# Patient Record
Sex: Female | Born: 1965 | Race: Black or African American | Hispanic: No | Smoking: Never smoker
Health system: Southern US, Community
[De-identification: ages and names within clinical notes are randomized; demographics above are authoritative.]

## PROBLEM LIST (undated history)

## (undated) DIAGNOSIS — J45909 Unspecified asthma, uncomplicated: Secondary | ICD-10-CM

## (undated) HISTORY — DX: Unspecified asthma, uncomplicated: J45.909

---

## 2014-11-14 ENCOUNTER — Institutional Professional Consult (permissible substitution): Payer: Self-pay | Admitting: Pulmonary Disease

## 2014-11-30 ENCOUNTER — Encounter: Payer: Self-pay | Admitting: Pulmonary Disease

## 2014-11-30 ENCOUNTER — Ambulatory Visit (INDEPENDENT_AMBULATORY_CARE_PROVIDER_SITE_OTHER): Payer: BLUE CROSS/BLUE SHIELD | Admitting: Pulmonary Disease

## 2014-11-30 VITALS — BP 136/72 | HR 84 | Ht 60.0 in | Wt 140.2 lb

## 2014-11-30 DIAGNOSIS — R0600 Dyspnea, unspecified: Secondary | ICD-10-CM | POA: Diagnosis not present

## 2014-11-30 NOTE — Patient Instructions (Signed)
We will schedule you for a CT of the chest to further evaluate the apical scarring and pneumothorax.  Return to clinic in 1 month after the scan for further workup and evaluation.

## 2014-11-30 NOTE — Progress Notes (Signed)
Subjective:    Patient ID: Lori Romero, female    DOB: May 26, 1965, 49 y.o.   MRN: [id number]  HPI Consult for abnormal chest x-ray, positive QuantiFERON Gold.  Lori Romero is a 49 year old immigrant from [place]. She sent here for evaluation of abnormal chest x-ray and a positive QuantiFERON Gold test. He has history of severe scoliosis, and asthma. She developed asthma in 2008 after a brief visit to [place]. She was in and out of the hospital with a respiratory illness and was prescribed an inhaler. She took this for a few days and then stopped because it made her feel worse. She reports that she had multiple chest x-rays at that time and also one just before her return to [place] this year. She was not told that there were any abnormalities on her chest imaging.  She was evaluated at her new school with a QuantiFERON test which was positive in August 16. A subsequent chest x-ray showed right apical pleural scarring with a chronic pneumothorax. I do not have this image to review. She is sent here for further evaluation.   She was born and brought up in [place]. She had a brief visit the [place] in 2008. She moved back here today to go to college. She does not have any history of TB or known contact. She does not smoke or drink. She had been tested for HIV in the past and it was negative. She was monogamous with her husband but separated right now. She has never been homeless or incarcerated.  Today in the office she denies any cough, dyspnea, sputum production, fevers, chills, hemoptysis.  DATA: Chest x-ray [10/10/14] Very severe dextroscoliosis. Left lung clear Right apical pleural scarring Chronic pneumothorax right apex  QuantiFERON Gold [09/08/14] Positive  Past Medical History  Diagnosis Date  . Asthma    No current outpatient prescriptions on file.   Review of Systems Denies any cough, dyspnea, sputum production, fevers, chills, hemoptysis. Denies any malaise or fatigue, loss of  weight or appetite. Denies any nausea, vomiting, diarrhea, constipation. Denies any chest pain, palpitations. All other review of systems are negative.    Objective:   Physical Exam Blood pressure 136/72, pulse 84, height 5' (1.524 m), weight 140 lb 3.2 oz (63.594 kg), SpO2 97 %. Gen:No apparent distress Neuro: No gross focal deficits. Neck: No JVD, lymphadenopathy, thyromegaly. RS: Clear, no wheeze, crackles. Nonlabored breathing. CVS: S1-S2 heard, no murmurs rubs gallops. Abdomen: Soft, positive bowel sounds. Extremities: No edema.    Assessment & Plan:  Abnormal chest x-ray, positive QuantiFERON.  I suspect that the right apical lung findings may be related to her scoliosis. This is apparently chronic as per the chest x-ray reading. But it is puzzling that she reports many lung x rays including recently this year in [place] which were apparently normal. She had CT scan chest in [place] in 2008 for acute dyspnea. She will try to recall the name of this hospital and we will try to get images or report from that period. It is possible that this is related to the positive QuantiFERON and a past TB infection. Her only risk factor for TB is an immigrant status from [place]. She does not show symptoms of active TB.    We'll need to reevaluate with CT of the chest for a better look at this process. If there are no signs of active TB then we can continue to observe this. If active TB is ruled out then she will qualify for  treatment for latent TB given her positive QuantiFERON test.  Lori GreathousePraveen Kortlyn Koltz MD [facility] Pager [phone] If no answer or after 3pm call: [phone] 11/30/2014, 5:08 PM

## 2014-12-06 ENCOUNTER — Ambulatory Visit (HOSPITAL_COMMUNITY)
Admission: RE | Admit: 2014-12-06 | Discharge: 2014-12-06 | Disposition: A | Discharge status: Home / Self Care | Payer: BLUE CROSS/BLUE SHIELD | Source: Ambulatory Visit | Attending: Pulmonary Disease | Admitting: Pulmonary Disease

## 2014-12-06 DIAGNOSIS — M954 Acquired deformity of chest and rib: Secondary | ICD-10-CM | POA: Insufficient documentation

## 2014-12-06 DIAGNOSIS — M4184 Other forms of scoliosis, thoracic region: Secondary | ICD-10-CM | POA: Insufficient documentation

## 2014-12-06 DIAGNOSIS — R0600 Dyspnea, unspecified: Secondary | ICD-10-CM | POA: Diagnosis present

## 2014-12-11 NOTE — Progress Notes (Signed)
Quick Note:  atc pt, voicemail is not set up to leave message  wcb ______

## 2014-12-13 NOTE — Progress Notes (Signed)
Quick Note:  atc pt, voicemail not set up  wcb ______

## 2014-12-14 NOTE — Progress Notes (Signed)
Quick Note:  Attempted to call patient. Unable to LVM, due to no VM setup. ______

## 2014-12-15 ENCOUNTER — Encounter: Payer: Self-pay | Admitting: *Deleted

## 2014-12-15 ENCOUNTER — Telehealth: Payer: Self-pay | Admitting: *Deleted

## 2014-12-15 NOTE — Telephone Encounter (Signed)
-----   Message from Chilton GreathousePraveen Mannam, MD sent at 12/11/2014  1:43 PM EST ----- Please call the patient and let her know that the CT of the chest is normal. There is no evidence of any active infection.

## 2014-12-19 ENCOUNTER — Encounter: Payer: Self-pay | Admitting: Pulmonary Disease

## 2014-12-22 ENCOUNTER — Telehealth: Payer: Self-pay | Admitting: Pulmonary Disease

## 2014-12-22 NOTE — Telephone Encounter (Signed)
Notes Recorded by Chilton GreathousePraveen Mannam, MD on 12/11/2014 at 1:43 PM Please call the patient and let her know that the CT of the chest is normal. There is no evidence of any active infection. -----------------------------------------  Spoke with pt, aware of results/recs.  Nothing further needed.

## 2014-12-27 ENCOUNTER — Telehealth: Payer: Self-pay | Admitting: Pulmonary Disease

## 2014-12-27 NOTE — Telephone Encounter (Signed)
No note needed for this call, opened in error. Closed encounter.

## 2014-12-29 ENCOUNTER — Ambulatory Visit (INDEPENDENT_AMBULATORY_CARE_PROVIDER_SITE_OTHER): Payer: BLUE CROSS/BLUE SHIELD | Admitting: Pulmonary Disease

## 2014-12-29 ENCOUNTER — Encounter: Payer: Self-pay | Admitting: Pulmonary Disease

## 2014-12-29 VITALS — BP 98/62 | HR 81 | Ht 60.0 in | Wt 140.0 lb

## 2014-12-29 DIAGNOSIS — R0602 Shortness of breath: Secondary | ICD-10-CM | POA: Diagnosis not present

## 2014-12-29 DIAGNOSIS — R0600 Dyspnea, unspecified: Secondary | ICD-10-CM | POA: Diagnosis not present

## 2014-12-29 NOTE — Patient Instructions (Signed)
Return to clinic in 6 months.

## 2014-12-29 NOTE — Progress Notes (Signed)
Subjective:    Patient ID: Lori Romero, female    DOB: [birth year], 49 y.o.   MRN: [id number]  HPI Follow-up for abnormal chest x-ray, positive QuantiFERON Gold.  [name] is a 49 year old immigrant from [place]. She sent here for evaluation of abnormal chest x-ray and a positive QuantiFERON Gold test. He has history of severe scoliosis, and asthma. She developed asthma in 2008 after a brief visit [place] She was in and out of the hospital with a respiratory illness and was prescribed an inhaler. She took this for a few days and then stopped because it made her feel worse. She reports that she had multiple chest x-rays at that time and also one just before her return to [place] this [place]. She was not told that there were any abnormalities on her chest imaging.  She was evaluated at her new school with a QuantiFERON test which was positive in August 16. A subsequent chest x-ray showed right apical pleural scarring with a chronic pneumothorax. I do not have this image to review.  She subsequently had a CT scan which did not show any right apical abnormality or pneumothorax. It showed severe scoliosis with compression of the right lung.  She was born and brought up in [place]. She had a brief visit the [place] in 2008. She moved back here today to go to college. She does not have any history of TB or known contact. She does not smoke or drink. She had been tested for HIV in the past and it was negative. She was monogamous with her husband but separated right now. She has never been homeless or incarcerated.  Today in the office she denies any cough, dyspnea, sputum production, fevers, chills, hemoptysis.  DATA: Chest x-ray [10/10/14] Very severe dextroscoliosis. Left lung clear Right apical pleural scarring Chronic pneumothorax right apex  QuantiFERON Gold [09/08/14] Positive  CT scan [12/06/14] 1. No findings to suggest active intrathoracic tuberculosis. Additionally, there is no scarring in  the lungs to suggest prior tuberculous infection. 2. No acute findings in the thorax. 3. 2 tiny 1-2 mm nonobstructive calculi in the interpolar collecting system of the left kidney. 4. 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  Spirometry [12/29/14] FVC 1.23 [51%] FEV1 1.07 [52%] F/F 87 No obstruction, severe restrictive lung disease   Past Medical History  Diagnosis Date  . Asthma    No current outpatient prescriptions on file.   Review of Systems Denies any cough, dyspnea, sputum production, fevers, chills, hemoptysis. Denies any malaise or fatigue, loss of weight or appetite. Denies any nausea, vomiting, diarrhea, constipation. Denies any chest pain, palpitations. All other review of systems are negative.    Objective:   Physical Exam Blood pressure 98/62, pulse 81, height 5' (1.524 m), weight 140 lb (63.504 kg), last menstrual period 12/04/2014, SpO2 100 %. Gen:No apparent distress Neuro: No gross focal deficits. Neck: No JVD, lymphadenopathy, thyromegaly. RS: Clear, no wheeze, crackles. Nonlabored breathing. CVS: S1-S2 heard, no murmurs rubs gallops. Abdomen: Soft, positive bowel sounds. Extremities: No edema.    Assessment & Plan:  Abnormal chest x-ray, positive QuantiFERON.  Although there is a reading of right apical scarring and pneumothorax on the chest x-ray, CT is normal except for chronic right chest wall and scoliosis deformities. There is no evidence of active TB or any parenchymal abnormality. There is no need for any additional follow-up imaging. She does not  Active  TB.  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 I discussed starting therapy with INH for 9 months or rifampin for 6 months with her.However she is not feeling well at the moment and decided to defer this treatment. She'll reconsider next year when she  is feeling better.  She reports feeling increasing dyspnea and discomfort on the right side was sitting in class. This is likely secondary to restrictive lung disease, chest wall deformity, scoliosis. Her FEV1 in office is 52% of predicted. Exposure to cold air has worsened dyspnea, wheezing. She does have history of asthma in the past but did not tolerate inhalers in the past and is not interested in retrying them.  It is reasonable for her to cut back on some of her college credits and work this semester so that she can recover.  Return to clinic in 6 months  Chilton Greathouse MD [facility] Pager [phone] If no answer or after 3pm call: [phone] 12/29/2014, 2:31 PM

## 2015-01-01 ENCOUNTER — Ambulatory Visit: Payer: BLUE CROSS/BLUE SHIELD | Admitting: Pulmonary Disease

## 2015-01-03 ENCOUNTER — Telehealth: Payer: Self-pay | Admitting: Pulmonary Disease

## 2015-01-03 NOTE — Telephone Encounter (Signed)
Last seen on 12/29/14 PM It is reasonable for her to cut back on some of her college credits and work this semester so that she can recover.  Return to clinic in 6 months  Chilton GreathousePraveen Mannam MD [facility] Pager [phone] If no answer or after 3pm call: [phone] 12/29/2014, 2:31 PM        Called and spoke with patient. She stated that she needs a letter stating that PM has recommended for her to cut back on classes. She stated that it needed to be addressed to the [employer] of [employer] at [employer]. Once letter was written she wanted it mailed to her address on file so that she can take it to the advisor personally to discuss her options. I explained to her that I could write a letter stating what the office note suggested and would have it mailed out today. She voiced understanding and had no further questions. Letter placed at the front office to be mailed out. Nothing further needed. Will sign off on message.

## 2015-01-16 ENCOUNTER — Telehealth: Payer: Self-pay | Admitting: Pulmonary Disease

## 2015-01-16 NOTE — Telephone Encounter (Signed)
Patient calling to speak directly with Dr. Isaiah SergeMannam.  She wants to know more information about her condition.  I advised patient that Dr. Isaiah SergeMannam is in clinic and he can call her back after clinic, but she is requesting that he call her back ASAP, she did not want to wait until after 5pm.  Patient can be reached at [phone]

## 2015-01-16 NOTE — Telephone Encounter (Signed)
[phone], pt cb

## 2015-01-16 NOTE — Telephone Encounter (Signed)
Letter created and signed by PM.   Advised patient that letter is finished.  She requested that we mail it to her. Confirmed mailing address. Placed in mail for patient. Nothing further needed.

## 2015-01-16 NOTE — Telephone Encounter (Signed)
lmtcb x1 for pt. 

## 2015-01-16 NOTE — Telephone Encounter (Signed)
Pt is requesting a letter to be sent as before to the same recipient that we advise her to take the semester off due to her poor respiratory condition. Please draft and send. Thanks.

## 2015-01-24 ENCOUNTER — Telehealth: Payer: Self-pay | Admitting: Pulmonary Disease

## 2015-01-24 NOTE — Telephone Encounter (Signed)
Called and spoke with patient. She states that she spoke with her advisor at school and the advisor is requesting that the letter we sent be resent. She states that the letter needs to be more specific. Stating that per her condition she is suffering it is recommended that she not return back to school until August. She is requesting to speak with PM to make sure that the letter is worded correctly. She stated that the letter will need to be faxed to [phone]. I explained to the patient that I will send a message to PM but he was out of the office and he would answer her message once he returned. Patient voiced understanding and had no further questions.   PM please advise

## 2015-01-25 NOTE — Telephone Encounter (Signed)
Called spoke with pt. She is calling back to check on this. She reports they need a response today to update her records.  Please advise Dr. Isaiah SergeMannam.

## 2015-01-25 NOTE — Telephone Encounter (Signed)
Patient called to check on status of phone call, CB [phone].

## 2015-01-25 NOTE — Telephone Encounter (Signed)
Letter dictated per PM's specifics Letter printed and signed by PM Called spoke with patient, verified fax number and person to whom the letter needs to be attn >> Agustina CaroliKyle Elekwa Letter faxed  Nothing further needed; will sign off

## 2015-06-14 ENCOUNTER — Encounter: Payer: Self-pay | Admitting: Pulmonary Disease

## 2015-06-14 ENCOUNTER — Ambulatory Visit (INDEPENDENT_AMBULATORY_CARE_PROVIDER_SITE_OTHER): Payer: Self-pay | Admitting: Pulmonary Disease

## 2015-06-14 VITALS — BP 108/62 | HR 87 | Ht 60.0 in | Wt 128.0 lb

## 2015-06-14 DIAGNOSIS — R0600 Dyspnea, unspecified: Secondary | ICD-10-CM

## 2015-06-14 DIAGNOSIS — R0602 Shortness of breath: Secondary | ICD-10-CM

## 2015-06-14 MED ORDER — ALBUTEROL SULFATE HFA 108 (90 BASE) MCG/ACT IN AERS: 2.0000 | INHALATION_SPRAY | Freq: Four times a day (QID) | RESPIRATORY_TRACT | Status: DC | PRN

## 2015-06-14 NOTE — Progress Notes (Signed)
Subjective:    Patient ID: Lori Romero, female    DOB: March 10, 1965, 50 y.o.   MRN: [id number]  HPI Follow-up for abnormal chest x-ray, positive QuantiFERON Gold.  Lori Romero is a 50 year old immigrant from [place]. She sent here for evaluation of abnormal chest x-ray and a positive QuantiFERON Gold test. He has history of severe scoliosis, and asthma. She developed asthma in 2008 after a brief visit [place]. She was in and out of the hospital with a respiratory illness and was prescribed an inhaler. She took this for a few days and then stopped because it made her feel worse. She reports that she had multiple chest x-rays at that time and also one just before her return to [place] this [place]. She was not told that there were any abnormalities on her chest imaging.  She was evaluated at her new school with a QuantiFERON test which was positive in August 16. A subsequent chest x-ray showed right apical pleural scarring with a chronic pneumothorax. I do not have this image to review.  She subsequently had a CT scan which did not show any right apical abnormality or pneumothorax. It showed severe scoliosis with compression of the right lung.  She was born and brought up in [place]. She had a brief visit the [place] in [place]. She moved back here today to go to college. She does not have any history of TB or known contact. She does not smoke or drink. She had been tested for HIV in the past and it was negative. She was monogamous with her husband but separated right now. She has never been homeless or incarcerated.  Today in the office she feels well. Denies any cough, dyspnea, sputum production, fevers, chills, hemoptysis.  DATA: Chest x-ray [10/10/14] Very severe dextroscoliosis. Left lung clear Right apical pleural scarring Chronic pneumothorax right apex  QuantiFERON Gold [09/08/14] Positive  CT scan [12/06/14] 1. No findings to suggest active intrathoracic tuberculosis. Additionally, there is no  scarring in the lungs to suggest prior tuberculous infection. 2. No acute findings in the thorax. 3. 2 tiny 1-2 mm nonobstructive calculi in the interpolar collecting system of the left kidney. 4. 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  Spirometry [12/29/14] FVC 1.23 [51%] FEV1 1.07 [52%] F/F 87 No obstruction, severe restrictive lung disease   Past Medical History  Diagnosis Date  . Asthma    No current outpatient prescriptions on file.  Review of Systems Denies any cough, dyspnea, sputum production, fevers, chills, hemoptysis. Denies any malaise or fatigue, loss of weight or appetite. Denies any nausea, vomiting, diarrhea, constipation. Denies any chest pain, palpitations. All other review of systems are negative.    Objective:   Physical Exam Blood pressure 108/62, pulse 87, height 5' (1.524 m), weight 128 lb (58.06 kg), SpO2 99 %. Gen:No apparent distress Neuro: No gross focal deficits. Neck: No JVD, lymphadenopathy, thyromegaly. RS: Clear, no wheeze, crackles. Nonlabored breathing. CVS: S1-S2 heard, no murmurs rubs gallops. Abdomen: Soft, positive bowel sounds. Extremities: No edema.    Assessment & Plan:  #1 Abnormal chest x-ray, positive QuantiFERON.  Although there is a reading of right apical scarring and pneumothorax on the chest x-ray, CT is normal except for chronic right chest wall and scoliosis deformities. There is no evidence of active TB or any parenchymal abnormality. There is no need for any additional follow-up imaging. She does not  Active TB.  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 I discussed starting therapy with INH for 9 months or rifampin for 6 months with her.However she is not feeling well at the moment and decided to defer this treatment.   #2 Asthma, Dyspea She still has some dyspnea,  worsened by exposure to cold air. This is likely secondary to restrictive lung disease, chest wall deformity, scoliosis. Her FEV1 in office is 52% of predicted. She does have history of asthma in the past but did not tolerate inhalers in the past and is not interested in retrying them.  She cut back on her college workload because of respiratory issues over the winter. She is feeling better now and wants to resume college. She is requesting a letter to restart her college. We will give her a copy and fax it to her college advisor.  Plan: - Continue using the albuterol rescue inhaler as needed - Ok to resume college at reduced work load of 6-8 credits.  Return to clinic in 6 months  Chilton Greathouse MD [facility] Pager [phone] If no answer or after 3pm call: [phone] 06/14/2015, 9:42 AM

## 2015-06-14 NOTE — Patient Instructions (Signed)
Continue new using albuterol rescue inhaler as needed. We will send a letter stating that it's okay to resume college at reduced workload for the fall semester.  Return to clinic in 6 months.

## 2015-06-21 ENCOUNTER — Ambulatory Visit: Payer: BLUE CROSS/BLUE SHIELD | Admitting: Pulmonary Disease

## 2015-06-29 ENCOUNTER — Ambulatory Visit: Payer: BLUE CROSS/BLUE SHIELD | Admitting: Pulmonary Disease

## 2015-09-06 ENCOUNTER — Telehealth: Payer: Self-pay | Admitting: Pulmonary Disease

## 2015-09-06 NOTE — Telephone Encounter (Signed)
Called spoke with patient. She is requesting to speak with Dr. Isaiah SergeMannam personally about her health. She did not want to go into detail about exactly what but request we send her message straight to Dr. Isaiah SergeMannam. She is aware he is not in the office until next week. Please advise thanks

## 2015-09-13 NOTE — Telephone Encounter (Signed)
[phone] pt calling back

## 2015-09-13 NOTE — Telephone Encounter (Signed)
Pt is calling back. Will route message back to PM to make him aware.

## 2015-09-13 NOTE — Telephone Encounter (Signed)
Dr. Mannam - please advise. Thanks. 

## 2015-09-13 NOTE — Telephone Encounter (Signed)
Pt has increasing back pain from scoliosis. She feels that the brace is not working and is taking tyelnol to help. She is requesting a letter to her advisor that it is ok to cut back classes this semester due to health issues.  I will send a letter stating this. I will also make a referral to Orthopedics for evaluation.

## 2015-09-13 NOTE — Telephone Encounter (Signed)
I called and left a voicemail. I will try again later today

## 2015-09-13 NOTE — Telephone Encounter (Signed)
Will route to JJ for a reminder.

## 2015-09-19 NOTE — Telephone Encounter (Signed)
Dr. Isaiah SergeMannam have you written this letter yet? Thanks.

## 2015-09-20 NOTE — Telephone Encounter (Signed)
Hi Lori Romero, Can we send a letter about this to her college advisor. We have send similar letters in past and we can use the same template. Thanks

## 2015-09-20 NOTE — Telephone Encounter (Signed)
Spoke with pt. She is aware that her letter is complete. Pt needs this faxed to [phone]. This has been faxed. She would like a copy of this letter mailed to her. Letter has been placed in the outgoing mail. Nothing further was needed.

## 2015-10-30 ENCOUNTER — Telehealth: Payer: Self-pay | Admitting: Pulmonary Disease

## 2015-10-30 NOTE — Telephone Encounter (Signed)
I don't know if this has something to do w/ the 2016 letter or if she is calling for a new one, not seeing any new messages besides this one.Lori GriffinsStanley A Dalton

## 2015-10-30 NOTE — Telephone Encounter (Signed)
Patient calling stating that she needs another letter for her school for [employer]. Needs it to be faxed to same number as other letters, listed in chart.  Dr. Isaiah SergeMannam, please advise.

## 2015-11-02 NOTE — Telephone Encounter (Signed)
Ok with giving the letter. I would like to see her in office as well. Please make an appointment.

## 2015-11-02 NOTE — Telephone Encounter (Signed)
Spoke with the pt and notified of recs per PM  OV on 11/08/15 at 12 noon

## 2015-11-08 ENCOUNTER — Encounter: Payer: Self-pay | Admitting: Pulmonary Disease

## 2015-11-08 ENCOUNTER — Ambulatory Visit (INDEPENDENT_AMBULATORY_CARE_PROVIDER_SITE_OTHER): Payer: Self-pay | Admitting: Pulmonary Disease

## 2015-11-08 ENCOUNTER — Other Ambulatory Visit (INDEPENDENT_AMBULATORY_CARE_PROVIDER_SITE_OTHER): Payer: Self-pay

## 2015-11-08 VITALS — BP 118/64 | HR 98 | Ht 64.0 in | Wt 123.2 lb

## 2015-11-08 DIAGNOSIS — M419 Scoliosis, unspecified: Secondary | ICD-10-CM

## 2015-11-08 LAB — CBC WITH DIFFERENTIAL/PLATELET
BASOS PCT: 0.8 % (ref 0.0–3.0)
Basophils Absolute: 0.1 10*3/uL (ref 0.0–0.1)
EOS ABS: 0 10*3/uL (ref 0.0–0.7)
EOS PCT: 0.4 % (ref 0.0–5.0)
HCT: 38.1 % (ref 36.0–46.0)
HEMOGLOBIN: 12.8 g/dL (ref 12.0–15.0)
LYMPHS ABS: 3 10*3/uL (ref 0.7–4.0)
Lymphocytes Relative: 45.8 % (ref 12.0–46.0)
MCHC: 33.6 g/dL (ref 30.0–36.0)
MCV: 92 fl (ref 78.0–100.0)
MONO ABS: 0.7 10*3/uL (ref 0.1–1.0)
Monocytes Relative: 9.9 % (ref 3.0–12.0)
NEUTROS ABS: 2.9 10*3/uL (ref 1.4–7.7)
Neutrophils Relative %: 43.1 % (ref 43.0–77.0)
PLATELETS: 283 10*3/uL (ref 150.0–400.0)
RBC: 4.14 Mil/uL (ref 3.87–5.11)
RDW: 13 % (ref 11.5–15.5)
WBC: 6.6 10*3/uL (ref 4.0–10.5)

## 2015-11-08 MED ORDER — AMOXICILLIN-POT CLAVULANATE 875-125 MG PO TABS: 1.0000 | ORAL_TABLET | Freq: Two times a day (BID) | ORAL | 0 refills | Status: AC

## 2015-11-08 MED ORDER — ALBUTEROL SULFATE HFA 108 (90 BASE) MCG/ACT IN AERS: 2.0000 | INHALATION_SPRAY | Freq: Four times a day (QID) | RESPIRATORY_TRACT | 5 refills | Status: AC | PRN

## 2015-11-08 MED ORDER — TERBINAFINE 1 % EX GEL: 1.0000 "application " | Freq: Every day | CUTANEOUS | 0 refills | Status: AC

## 2015-11-08 NOTE — Progress Notes (Signed)
Subjective:    Patient ID: Lori HangerMillicent Romero, female    DOB: [birth year], 50 y.o.   MRN: [id number]  HPI Follow-up for abnormal chest x-ray, positive QuantiFERON Gold.  Lori Romero is a 50 year old immigrant from [place]. She sent here for evaluation of abnormal chest x-ray and a positive QuantiFERON Gold test. He has history of severe scoliosis, and asthma. She developed asthma in 2008 after a brief visit to [place]. She was in and out of the hospital with a respiratory illness and was prescribed an inhaler. She took this for a few days and then stopped because it made her feel worse. She reports that she had multiple chest x-rays at that time and also one just before her return to [place] this year. She was not told that there were any abnormalities on her chest imaging.  She was evaluated at her new school with a QuantiFERON test which was positive in August 16. A subsequent chest x-ray showed right apical pleural scarring with a chronic pneumothorax. I do not have this image to review.  She subsequently had a CT scan which did not show any right apical abnormality or pneumothorax. It showed severe scoliosis with compression of the right lung.  She was born and brought up in [place]. She had a brief visit the [place] in 2008. She moved back here today to go to college. She does not have any history of TB or known contact. She does not smoke or drink. She had been tested for HIV in the past and it was negative. She was monogamous with her husband but separated right now. She has never been homeless or incarcerated.  Interim History: Today in the office she feels well. Denies any cough, dyspnea, sputum production, fevers, chills, hemoptysis.She hardly ever needs to use albuterol inhaler. Her chief complaint is tooth ache, a rash on her arms and chest and spine pain which is chronic from her severe scoliosis.   DATA: Chest x-ray [10/10/14] Very severe dextroscoliosis. Left lung clear Right apical pleural  scarring Chronic pneumothorax right apex. Images reviewed  QuantiFERON Gold [09/08/14] Positive  CT scan [12/06/14] 1. No findings to suggest active intrathoracic tuberculosis. Additionally, there is no scarring in the lungs to suggest prior tuberculous infection. 2. No acute findings in the thorax. 3. 2 tiny 1-2 mm nonobstructive calculi in the interpolar collecting system of the left kidney. 4. 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 Images reviewed  Spirometry [12/29/14] FVC 1.23 [51%] FEV1 1.07 [52%] F/F 87 No obstruction, severe restrictive lung disease   Past Medical History:  Diagnosis Date  . Asthma     Current Outpatient Prescriptions:  .  albuterol (PROVENTIL HFA;VENTOLIN HFA) 108 (90 Base) MCG/ACT inhaler, Inhale 2 puffs into the lungs every 6 (six) hours as needed for wheezing or shortness of breath., Disp: 18 g, Rfl: 5  Review of Systems Denies any cough, dyspnea, sputum production, fevers, chills, hemoptysis. Denies any malaise or fatigue, loss of weight or appetite. Denies any nausea, vomiting, diarrhea, constipation. Denies any chest pain, palpitations. All other review of systems are negative.    Objective:   Physical Exam Blood pressure 108/62, pulse 87, height 5' (1.524 m), weight 128 lb (58.06 kg), SpO2 99 %. Gen:No apparent distress Neuro: No gross focal deficits. Neck: No JVD, lymphadenopathy, thyromegaly. RS: Clear, no wheeze, crackles. Nonlabored breathing. CVS: S1-S2 heard, no murmurs rubs gallops. Abdomen: Soft, positive bowel sounds. Extremities:  No edema.    Assessment & Plan:  #1 Abnormal chest x-ray, positive QuantiFERON.  Although there is a reading of right apical scarring and pneumothorax on the chest x-ray, CT is normal except for chronic right chest wall and scoliosis deformities. There is no evidence of active TB or  any parenchymal abnormality. There is no need for any additional follow-up imaging. She does not  Active TB.  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 I discussed starting therapy with INH for 9 months or rifampin for 6 months with her.However she is not feeling well at the moment and decided to defer this treatment.   #2 Asthma, Dyspea She still has some dyspnea, worsened by exposure to cold air. This is likely secondary to restrictive lung disease, chest wall deformity, scoliosis. Her FEV1 in office is 52% of predicted. She does have history of asthma in the past but did not tolerate inhalers in the past and is not interested in retrying them.  She wants cut back on her college workload because of continued respiratory issues over the winter. We will give her a copy and fax it to her college advisor.  #3 Tooth ache She has a tooth ache for several days. The gums are tender on examination. I will give her Augmentin and she will get a dentist appointment for further eval  #4 Rash Her rash appears to be tinea corporis. I will prescribe topical antifungals.  Plan: - Continue using the albuterol rescue inhaler as needed - We will send a letter to your college about reduced credits. - Amox for 7 days for tooth ache - Lamisil gel for rash   Return to clinic as needed.  Chilton Greathouse MD [facility] Pager [phone] If no answer or after 3pm call: [phone] 11/08/2015, 12:15 PM

## 2015-11-08 NOTE — Patient Instructions (Addendum)
We will make a referral to [facility] for evaluation of scoliosis. We will give a prescription for augmentin 875 mg every 12 hours for 10 days. Make sure you have a follow up with the dentist We will give a prescription for LamISIL Gel: Apply to affected area once daily for at least 1 week. We will give a prescription for albuterol rescue inhaler.  Return as needed

## 2015-11-09 ENCOUNTER — Telehealth: Payer: Self-pay | Admitting: Pulmonary Disease

## 2015-11-09 NOTE — Telephone Encounter (Signed)
Called and spoke with pt and she is aware of results.    Pt would like to have a copy of the letter mailed to her that has been sent to her school.

## 2015-11-12 NOTE — Telephone Encounter (Signed)
Margie please advise when this letter is complete.  Thanks!

## 2015-11-12 NOTE — Telephone Encounter (Signed)
Patient returning call. She doesn't know for sure the name and fax number to send letter - she states she will check and get back to us. She also mentioned that we have previously faxed information to the same place so it may be in her file - She can be reached at [phone]

## 2015-11-12 NOTE — Telephone Encounter (Signed)
Pt aware that letter has been faxed. Pt request that we send a copy to her address on file. Letter placed up front to be mailed out. Nothing further needed.

## 2015-11-12 NOTE — Telephone Encounter (Signed)
PM please advise once this letter has been completed for the pt so that we may mail her a copy. thanks

## 2015-11-12 NOTE — Telephone Encounter (Signed)
Check with Margie. She was working on the letter. We will need to call Bryann to get the name and fax number of the person to send the letter to.

## 2015-11-15 ENCOUNTER — Telehealth: Payer: Self-pay | Admitting: Pulmonary Disease

## 2015-11-15 NOTE — Telephone Encounter (Signed)
Called and spoke to pt. Pt wanted to confirm the letter excusing her from school has been faxed. Per 10.27.17 phone note this has been faxed - informed pt. Pt verbalized understanding and denied any further questions or concerns at this time.

## 2016-10-26 IMAGING — CT CT CHEST W/O CM
2 of 6 series · 13 of 36 positions shown, 16 images · non-contrast
Comparison: No priors.

CLINICAL DATA: 49-year-old female with abnormal chest x-ray.
Evaluate for evidence of pulmonary tuberculosis.

EXAM:
CT CHEST WITHOUT CONTRAST
TECHNIQUE: Multidetector CT imaging of the chest was performed following the
standard protocol without IV contrast.

[Series 4: chest with st · axial · 0.62mm/px · z∈[-210,-26]mm · 10 of 47 slices shown, 13 images]
[im 5/47  mediastinal]
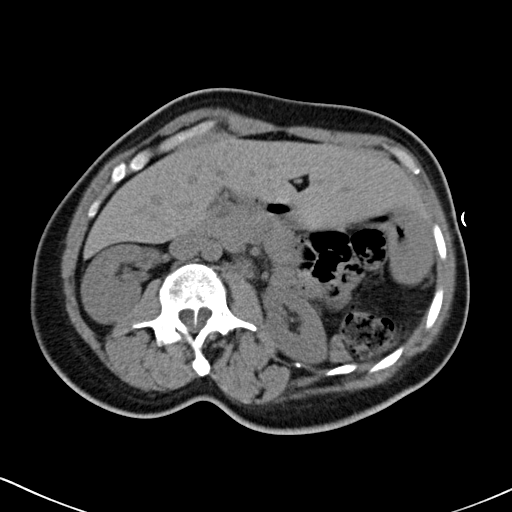
[im 5/47  lung]
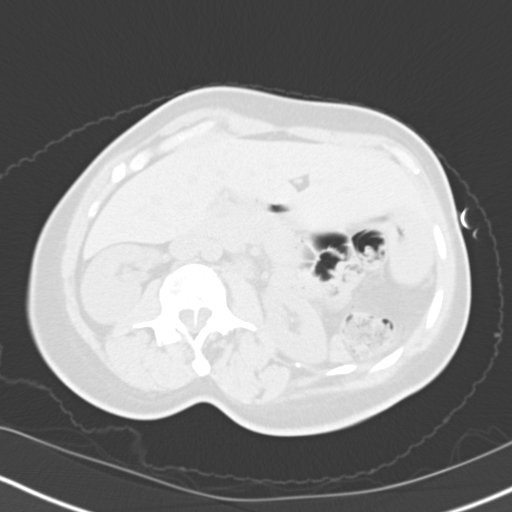
[im 9/47  lung]
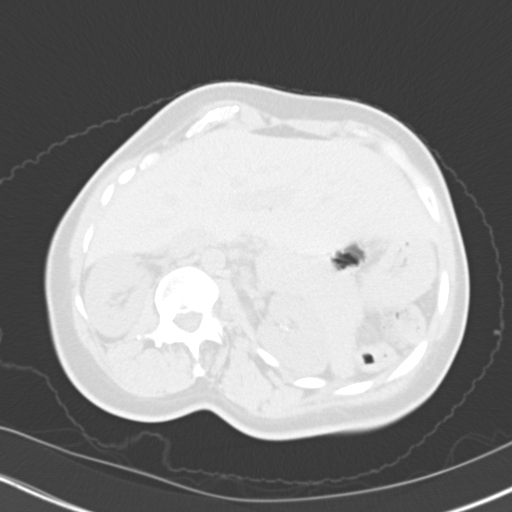
[im 13/47  lung]
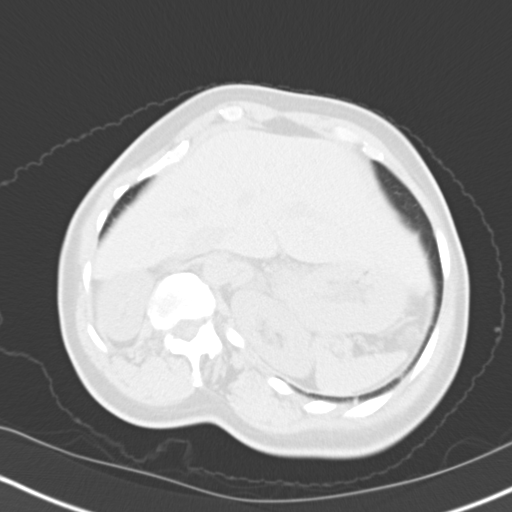
[im 17/47  lung]
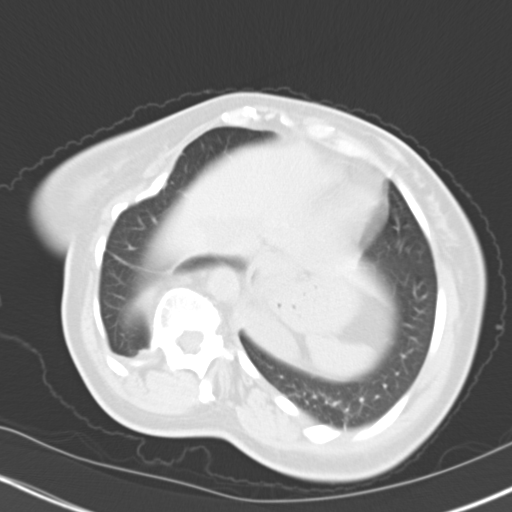
[im 21/47  mediastinal]
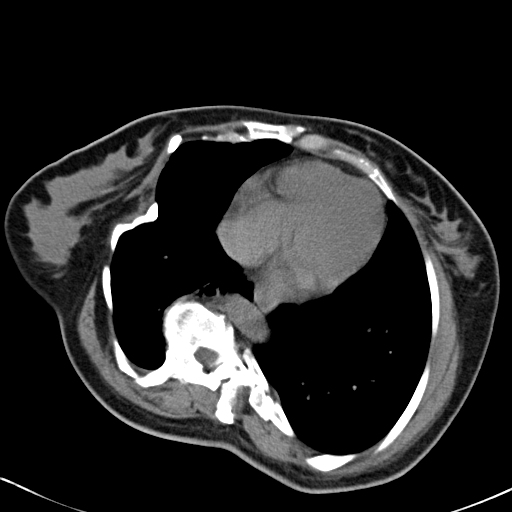
[im 21/47  lung]
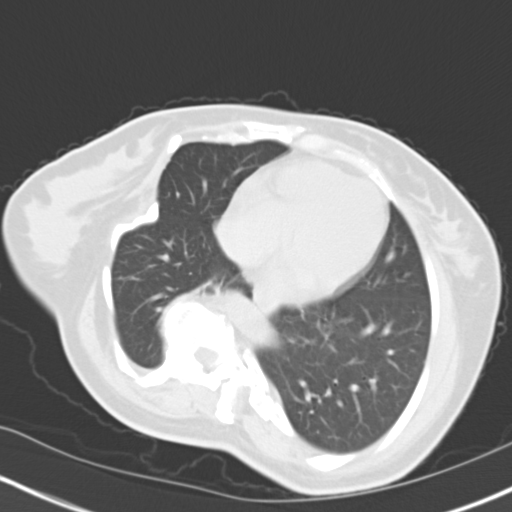
[im 26/47  lung]
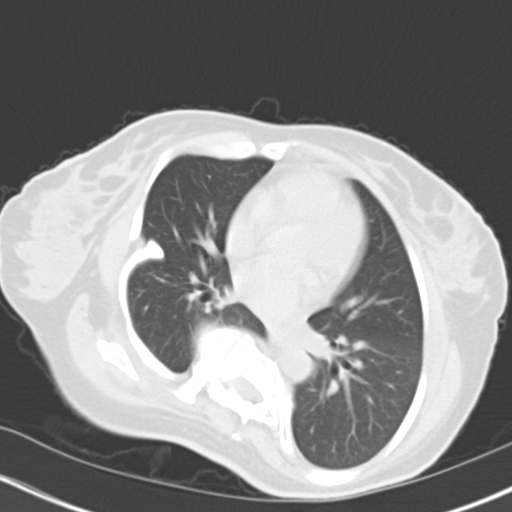
[im 30/47  lung]
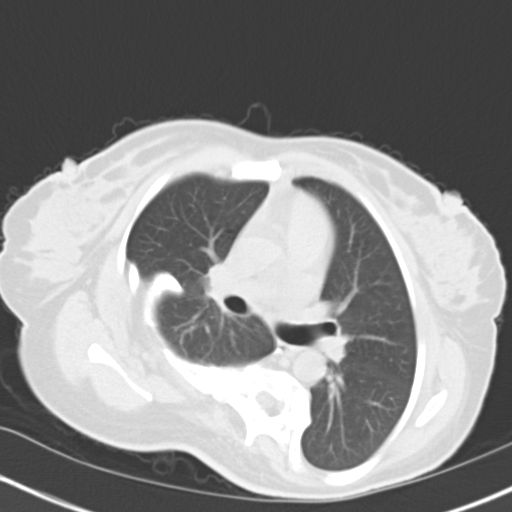
[im 34/47  lung]
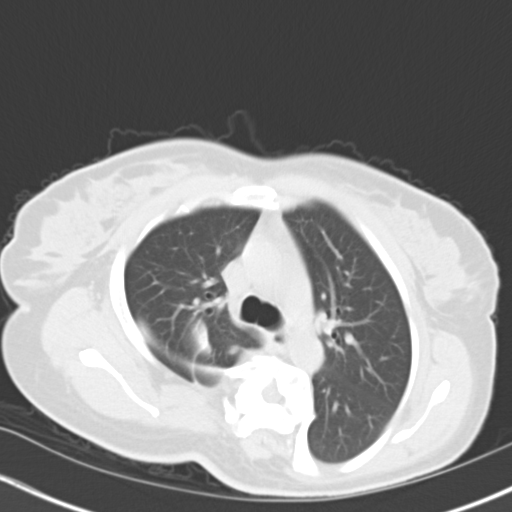
[im 38/47  mediastinal]
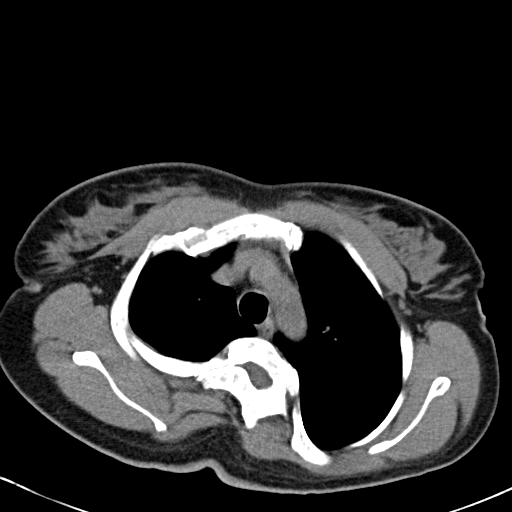
[im 38/47  lung]
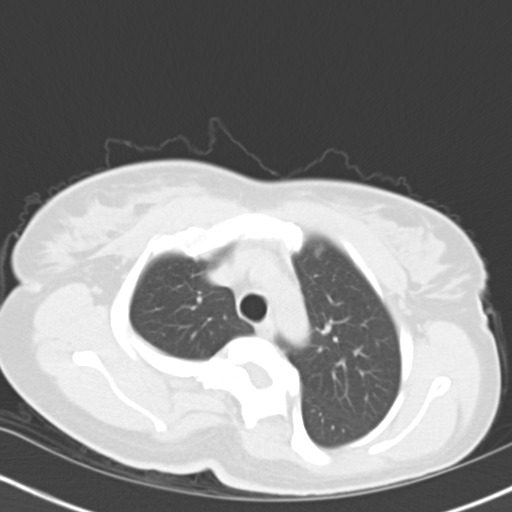
[im 42/47  lung]
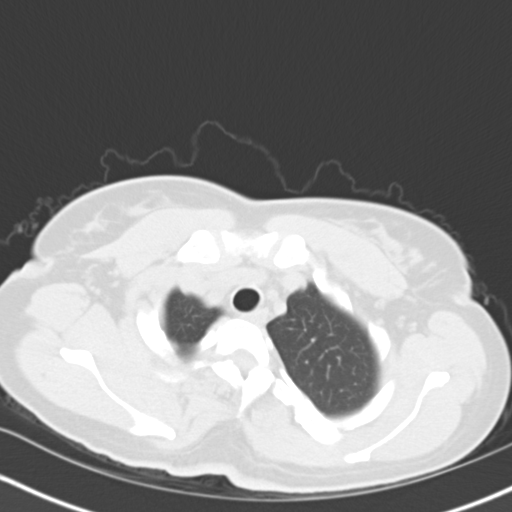

[Series 602: cor · coronal · 0.62mm/px · 3 of 82 slices shown]
[im 17/82  lung]
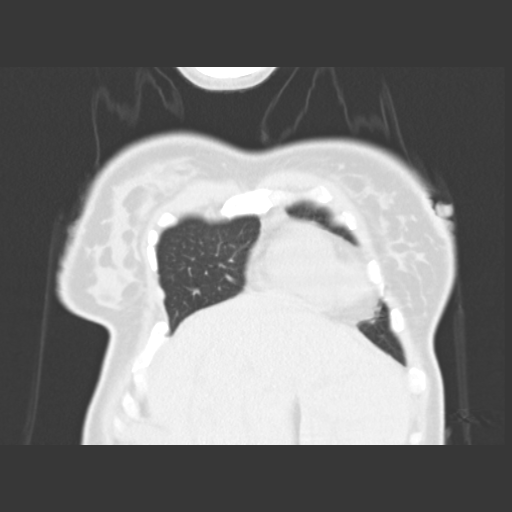
[im 33/82  lung]
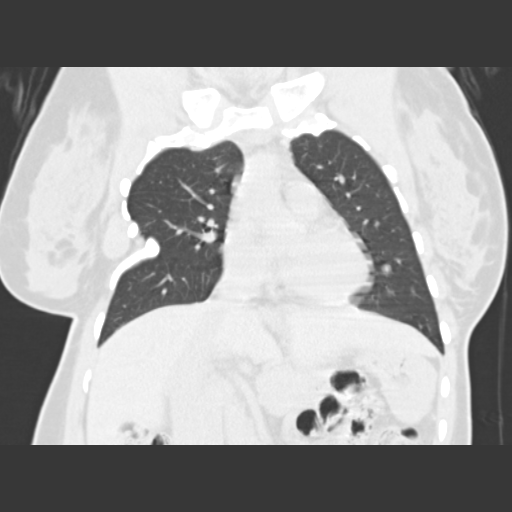
[im 49/82  lung]
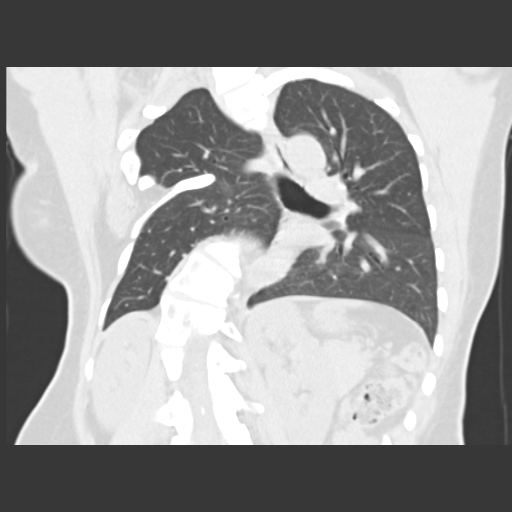

[13 of 36 positions shown; findings below may reference images not displayed]

FINDINGS: Mediastinum/Lymph Nodes: Heart size is normal. There is no
significant pericardial fluid, thickening or pericardial
calcification. No pathologically enlarged mediastinal or hilar lymph
nodes. Please note that accurate exclusion of hilar adenopathy is
limited on noncontrast CT scans. Esophagus is unremarkable in
appearance. No axillary lymphadenopathy.

Lungs/Pleura: Chronic right-sided volume loss related to severe
S-shaped scoliosis of the thoracolumbar spine with associated chest
wall deformity. No acute consolidative airspace disease. No pleural
effusions. High-resolution images demonstrate no areas of
bronchiectasis. Additionally, high-resolution images demonstrate no
significant regions of ground-glass attenuation, subpleural
reticulation or honeycombing. Inspiratory and expiratory imaging is
unremarkable.

Upper Abdomen: Tiny 1-2 mm nonobstructive calculi are present in the
interpolar collecting system of the left kidney.

Musculoskeletal/Soft Tissues: Severe S-shaped thoracolumbar
scoliosis convex to the left in the mid thoracic region and to the
right near the thoracolumbar junction. In part, this appears to be
related to multiple congenital anomalies in the mid thoracic spine
where there multiple hemivertebra and some fusion of lateral and
posterior elements, as well as adjacent right-sided ribs.
IMPRESSION: 1. No findings to suggest active intrathoracic tuberculosis.
Additionally, there is no scarring in the lungs to suggest prior
tuberculous infection.
2. No acute findings in the thorax.
3. 2 tiny 1-2 mm nonobstructive calculi in the interpolar collecting
system of the left kidney.
4. Congenital anomalies in the mid thoracic spine associated with
severe area shaped thoracolumbar scoliosis and right-sided chest
wall deformity, as above.

## 2017-02-20 ENCOUNTER — Telehealth: Payer: Self-pay | Admitting: Pulmonary Disease

## 2017-02-20 NOTE — Telephone Encounter (Signed)
Pt [facility] that we [facility] ct [facility] [phone] attn: Apolinar JunesBrandon  CT has been sent.  Nothing further needed.

## 2017-04-21 ENCOUNTER — Telehealth: Payer: Self-pay | Admitting: Pulmonary Disease

## 2017-04-21 NOTE — Telephone Encounter (Signed)
Attempted to call pt but no answer.   Left message for pt to return our call x1 

## 2017-04-22 NOTE — Telephone Encounter (Signed)
Left message for patient to call back x2.  

## 2017-04-22 NOTE — Telephone Encounter (Signed)
Pt is returning call. Cb is [phone].

## 2017-04-22 NOTE — Telephone Encounter (Signed)
Patient returned call, CB is [phone]

## 2017-04-23 NOTE — Telephone Encounter (Signed)
Attempted to call patient, no answer, left message to call back.  

## 2017-04-24 NOTE — Telephone Encounter (Signed)
Called and spoke with pt and per pt, she is needing [facility].  Fax number was given to me by pt and CT scan that was done in 2016 has been faxed for pt.  Nothing further needed at this time.

## 2019-10-05 ENCOUNTER — Telehealth: Payer: Self-pay | Admitting: Pulmonary Disease

## 2019-10-05 NOTE — Telephone Encounter (Signed)
Called patient regarding prior message. Patient stated she wanted to talk to Dr.Mannam regarding getting COVID vaccine and her situation patient did not stated her situation.Last office visit was 10/2015 Best call back number [phone].  Dr.Mannam can you please advise .  Thank you

## 2019-10-05 NOTE — Telephone Encounter (Signed)
Called and discussed with patient. She had some concerns about side effects.  Encouraged her to get the vaccine.  Nothing further needed.

## 2023-09-11 ENCOUNTER — Telehealth: Payer: Self-pay | Admitting: Pulmonary Disease

## 2023-09-11 NOTE — Telephone Encounter (Signed)
 Copied from CRM #[id number]. Topic: General - Other >> Sep 11, 2023  8:37 AM Celestine FALCON wrote: Reason for CRM: Pt is requesting a copy of both letters that Dr. Theophilus wrote for her sent to her email address at [email or web address]. She stated it was for reduced workload for two semesters. It appears the last one was sent back in 2017.  Pt's phone number is [phone] ok to leave a vm.

## 2023-09-16 NOTE — Telephone Encounter (Signed)
 I called and spoke with the pt  She states that she is needing copy of the letters that Dr. Theophilus wrote for her in 2016 and 2017  Also, she requested a copd of the CT Chest report from 11/2024  I have printed all of the above and mailed to her home address which I have verified with her  Nothing further needed
